# Patient Record
Sex: Female | Born: 1975 | Race: White | Hispanic: No | Marital: Married | Smoking: Never smoker
Health system: Southern US, Community
[De-identification: ages and names within clinical notes are randomized; demographics above are authoritative.]

## PROBLEM LIST (undated history)

## (undated) DIAGNOSIS — F32A Depression, unspecified: Secondary | ICD-10-CM

## (undated) DIAGNOSIS — F329 Major depressive disorder, single episode, unspecified: Secondary | ICD-10-CM

---

## 1998-10-18 ENCOUNTER — Encounter: Admission: RE | Admit: 1998-10-18 | Discharge: 1999-01-16 | Payer: Self-pay | Admitting: Obstetrics and Gynecology

## 1999-02-05 ENCOUNTER — Inpatient Hospital Stay (HOSPITAL_COMMUNITY): Admission: AD | Admit: 1999-02-05 | Discharge: 1999-02-07 | Payer: Self-pay | Admitting: Obstetrics & Gynecology

## 1999-09-18 ENCOUNTER — Other Ambulatory Visit: Admission: RE | Admit: 1999-09-18 | Discharge: 1999-09-18 | Payer: Self-pay | Admitting: Gynecology

## 2000-09-26 ENCOUNTER — Other Ambulatory Visit: Admission: RE | Admit: 2000-09-26 | Discharge: 2000-09-26 | Payer: Self-pay | Admitting: Gynecology

## 2001-10-06 ENCOUNTER — Other Ambulatory Visit: Admission: RE | Admit: 2001-10-06 | Discharge: 2001-10-06 | Payer: Self-pay | Admitting: Gynecology

## 2002-10-12 ENCOUNTER — Other Ambulatory Visit: Admission: RE | Admit: 2002-10-12 | Discharge: 2002-10-12 | Payer: Self-pay | Admitting: Obstetrics and Gynecology

## 2003-11-17 ENCOUNTER — Other Ambulatory Visit: Admission: RE | Admit: 2003-11-17 | Discharge: 2003-11-17 | Payer: Self-pay | Admitting: Obstetrics and Gynecology

## 2004-03-28 ENCOUNTER — Encounter: Admission: RE | Admit: 2004-03-28 | Discharge: 2004-03-28 | Payer: Self-pay | Admitting: Obstetrics and Gynecology

## 2004-06-01 ENCOUNTER — Inpatient Hospital Stay (HOSPITAL_COMMUNITY): Admission: AD | Admit: 2004-06-01 | Discharge: 2004-06-03 | Payer: Self-pay | Admitting: Pediatrics

## 2004-07-05 ENCOUNTER — Other Ambulatory Visit: Admission: RE | Admit: 2004-07-05 | Discharge: 2004-07-05 | Payer: Self-pay | Admitting: Obstetrics and Gynecology

## 2005-08-16 ENCOUNTER — Other Ambulatory Visit: Admission: RE | Admit: 2005-08-16 | Discharge: 2005-08-16 | Payer: Self-pay | Admitting: Obstetrics and Gynecology

## 2009-10-17 ENCOUNTER — Encounter: Admission: RE | Admit: 2009-10-17 | Discharge: 2009-10-17 | Payer: Self-pay | Admitting: Gastroenterology

## 2011-04-12 ENCOUNTER — Emergency Department (HOSPITAL_BASED_OUTPATIENT_CLINIC_OR_DEPARTMENT_OTHER)
Admission: EM | Admit: 2011-04-12 | Discharge: 2011-04-12 | Disposition: A | Discharge status: Home / Self Care | Payer: BC Managed Care – PPO | Attending: Emergency Medicine | Admitting: Emergency Medicine

## 2011-04-12 ENCOUNTER — Emergency Department (INDEPENDENT_AMBULATORY_CARE_PROVIDER_SITE_OTHER): Payer: BC Managed Care – PPO

## 2011-04-12 DIAGNOSIS — F411 Generalized anxiety disorder: Secondary | ICD-10-CM | POA: Insufficient documentation

## 2011-04-12 DIAGNOSIS — R0602 Shortness of breath: Secondary | ICD-10-CM

## 2011-05-31 ENCOUNTER — Other Ambulatory Visit: Payer: Self-pay | Admitting: Gastroenterology

## 2011-06-01 ENCOUNTER — Ambulatory Visit
Admission: RE | Admit: 2011-06-01 | Discharge: 2011-06-01 | Disposition: A | Discharge status: Home / Self Care | Payer: BC Managed Care – PPO | Source: Ambulatory Visit | Attending: Gastroenterology | Admitting: Gastroenterology

## 2012-07-17 ENCOUNTER — Other Ambulatory Visit: Payer: Self-pay | Admitting: Gastroenterology

## 2012-07-17 DIAGNOSIS — K824 Cholesterolosis of gallbladder: Secondary | ICD-10-CM

## 2012-07-23 ENCOUNTER — Ambulatory Visit
Admission: RE | Admit: 2012-07-23 | Discharge: 2012-07-23 | Disposition: A | Discharge status: Home / Self Care | Payer: BC Managed Care – PPO | Source: Ambulatory Visit | Attending: Gastroenterology | Admitting: Gastroenterology

## 2012-07-23 DIAGNOSIS — K824 Cholesterolosis of gallbladder: Secondary | ICD-10-CM

## 2013-07-28 ENCOUNTER — Other Ambulatory Visit: Payer: Self-pay | Admitting: Gastroenterology

## 2013-07-28 DIAGNOSIS — R109 Unspecified abdominal pain: Secondary | ICD-10-CM

## 2013-07-31 ENCOUNTER — Ambulatory Visit
Admission: RE | Admit: 2013-07-31 | Discharge: 2013-07-31 | Disposition: A | Discharge status: Home / Self Care | Payer: BC Managed Care – PPO | Source: Ambulatory Visit | Attending: Gastroenterology | Admitting: Gastroenterology

## 2013-07-31 DIAGNOSIS — R109 Unspecified abdominal pain: Secondary | ICD-10-CM

## 2013-07-31 MED ORDER — IOHEXOL 300 MG/ML  SOLN
100.0000 mL | Freq: Once | INTRAMUSCULAR | Status: AC | PRN
  Administered 2013-07-31: 100 mL via INTRAVENOUS

## 2015-12-16 ENCOUNTER — Emergency Department (HOSPITAL_BASED_OUTPATIENT_CLINIC_OR_DEPARTMENT_OTHER): Payer: BLUE CROSS/BLUE SHIELD

## 2015-12-16 ENCOUNTER — Emergency Department (HOSPITAL_BASED_OUTPATIENT_CLINIC_OR_DEPARTMENT_OTHER)
Admission: EM | Admit: 2015-12-16 | Discharge: 2015-12-16 | Disposition: A | Discharge status: Home / Self Care | Payer: BLUE CROSS/BLUE SHIELD | Attending: Emergency Medicine | Admitting: Emergency Medicine

## 2015-12-16 ENCOUNTER — Encounter (HOSPITAL_BASED_OUTPATIENT_CLINIC_OR_DEPARTMENT_OTHER): Payer: Self-pay | Admitting: *Deleted

## 2015-12-16 DIAGNOSIS — R0602 Shortness of breath: Secondary | ICD-10-CM

## 2015-12-16 DIAGNOSIS — Z793 Long term (current) use of hormonal contraceptives: Secondary | ICD-10-CM | POA: Diagnosis not present

## 2015-12-16 DIAGNOSIS — F319 Bipolar disorder, unspecified: Secondary | ICD-10-CM | POA: Insufficient documentation

## 2015-12-16 DIAGNOSIS — R7989 Other specified abnormal findings of blood chemistry: Secondary | ICD-10-CM | POA: Diagnosis not present

## 2015-12-16 HISTORY — DX: Depression, unspecified: F32.A

## 2015-12-16 HISTORY — DX: Major depressive disorder, single episode, unspecified: F32.9

## 2015-12-16 LAB — BASIC METABOLIC PANEL
Anion gap: 13 (ref 5–15)
BUN: 12 mg/dL (ref 6–20)
CALCIUM: 9.6 mg/dL (ref 8.9–10.3)
CO2: 21 mmol/L — ABNORMAL LOW (ref 22–32)
Chloride: 105 mmol/L (ref 101–111)
Creatinine, Ser: 0.79 mg/dL (ref 0.44–1.00)
GFR calc Af Amer: >60 mL/min (ref >60)
GLUCOSE: 128 mg/dL — AB (ref 65–99)
Potassium: 3.4 mmol/L — ABNORMAL LOW (ref 3.5–5.1)
Sodium: 139 mmol/L (ref 135–145)

## 2015-12-16 MED ORDER — IOHEXOL 350 MG/ML SOLN
100.0000 mL | Freq: Once | INTRAVENOUS | Status: AC | PRN
  Administered 2015-12-16: 100 mL via INTRAVENOUS

## 2015-12-16 NOTE — Discharge Instructions (Signed)

## 2015-12-16 NOTE — ED Provider Notes (Signed)
CSN: [id number]     Arrival date & time 12/16/15  2016 History   First MD Initiated Contact with Patient 12/16/15 2023     Chief Complaint  Patient presents with  . Shortness of Breath     (Consider location/radiation/quality/duration/timing/severity/associated sxs/prior Treatment) Patient is a 40 y.o. female presenting with shortness of breath. The history is provided by the patient. No language interpreter was used.  Shortness of Breath Severity:  Moderate Onset quality:  Gradual Timing:  Constant Progression:  Worsening Chronicity:  New Context: not activity   Relieved by:  Nothing Worsened by:  Nothing tried Ineffective treatments:  None tried Associated symptoms: no sore throat   pt saw her primary MD today for shortness of breath  Pt was told that she had an elevated ddimer and was sent here for a ct scan to make sure she does not have a pe.   Past Medical History  Diagnosis Date  . Depressed    History reviewed. No pertinent past surgical history. History reviewed. No pertinent family history. Social History  Substance Use Topics  . Smoking status: Never Smoker   . Smokeless tobacco: None  . Alcohol Use: No   OB History    No data available     Review of Systems  HENT: Negative for sore throat.   Respiratory: Positive for shortness of breath.   All other systems reviewed and are negative.     Allergies  Review of patient's allergies indicates no known allergies.  Home Medications   Prior to Admission medications   Medication Sig Start Date End Date Taking? Authorizing Provider  buPROPion (WELLBUTRIN XL) 300 MG 24 hr tablet Take 300 mg by mouth daily.   Yes Historical Provider, MD  drospirenone-ethinyl estradiol (YAZ,GIANVI,LORYNA) 3-0.02 MG tablet Take 1 tablet by mouth daily.   Yes Historical Provider, MD   BP 153/98 mmHg  Pulse 101  Temp(Src) 98.2 F (36.8 C)  Resp 16  Ht  (1.6 m)  Wt 78.019 kg  BMI 30.48 kg/m2  SpO2 100%  LMP  12/02/2015 Physical Exam  Constitutional: She is oriented to person, place, and time. She appears well-developed and well-nourished.  HENT:  Head: Normocephalic and atraumatic.  Right Ear: External ear normal.  Left Ear: External ear normal.  Nose: Nose normal.  Mouth/Throat: Oropharynx is clear and moist.  Eyes: Conjunctivae and EOM are normal. Pupils are equal, round, and reactive to light.  Neck: Normal range of motion.  Cardiovascular: Normal rate, regular rhythm, normal heart sounds and intact distal pulses.   Pulmonary/Chest: Effort normal.  Abdominal: She exhibits no distension.  Musculoskeletal: Normal range of motion.  Neurological: She is alert and oriented to person, place, and time.  Skin: Skin is warm.  Psychiatric: She has a normal mood and affect.  Nursing note and vitals reviewed.   ED Course  Procedures (including critical care time) Labs Review Labs Reviewed  BASIC METABOLIC PANEL - Abnormal; Notable for the following:    Potassium 3.4 (*)    CO2 21 (*)    Glucose, Bld 128 (*)    All other components within normal limits    Imaging Review Ct Angio Chest Pe W/cm &/or Wo Cm  12/16/2015  CLINICAL DATA:  Shortness of breath with positive D-dimer. EXAM: CT ANGIOGRAPHY CHEST WITH CONTRAST TECHNIQUE: Multidetector CT imaging of the chest was performed using the standard protocol during bolus administration of intravenous contrast. Multiplanar CT image reconstructions and MIPs were obtained to evaluate the vascular anatomy.  CONTRAST:  OMNIPAQUE IOHEXOL 350 MG/ML SOLN COMPARISON:  None. FINDINGS: THORACIC INLET/BODY WALL: No acute finding. The left lobe of the thyroid is larger than the right, but there is no demonstrable nodule. MEDIASTINUM: Normal heart size. No pericardial effusion. No evidence of pulmonary embolism. No aortic dissection. No adenopathy. LUNG WINDOWS: There is no edema, consolidation, effusion, or pneumothorax. 5 mm triangular subpleural nodule over  the left diaphragm, stable from 2014 CT and benign. UPPER ABDOMEN: No acute findings. OSSEOUS: No acute fracture.  No suspicious lytic or blastic lesions. Review of the MIP images confirms the above findings. IMPRESSION: No evidence of pulmonary embolism or other acute finding. Electronically Signed   By: Marnee Spring M.D.   On: 12/16/2015 21:16   I have personally reviewed and evaluated these images and lab results as part of my medical decision-making.   EKG Interpretation None     ekg  Sinus rhythm 96, left atrial enlargement, borderline t wave abnormality pr 624, qt is 357 Non acute  MDM  Pt advised to see her Md for recheck on Monday.  Pt advised to try mucinex otc.   Final diagnoses:  Shortness of breath        Elson Areas, PA-C 12/16/15 2254  Benjiman Core, MD 12/16/15 [phone]

## 2015-12-16 NOTE — ED Notes (Addendum)
Pt c/o SOB x 3 days denies CP  Sent here from [facility] for elevated d-dimer

## 2016-08-01 IMAGING — CT CT ANGIO CHEST
2 of 8 series · 19 of 46 positions shown · IV contrast (omnipaque)
Comparison: None.

CLINICAL DATA: Shortness of breath with positive D-dimer.

EXAM:
CT ANGIOGRAPHY CHEST WITH CONTRAST
TECHNIQUE: Multidetector CT imaging of the chest was performed using the
standard protocol during bolus administration of intravenous
contrast. Multiplanar CT image reconstructions and MIPs were
obtained to evaluate the vascular anatomy.
CONTRAST:  100mL OMNIPAQUE IOHEXOL 350 MG/ML SOLN

[Series 7: coronal mpr · coronal · 0.49mm/px · 3 of 120 slices shown]
[im 30/120  soft-tissue]
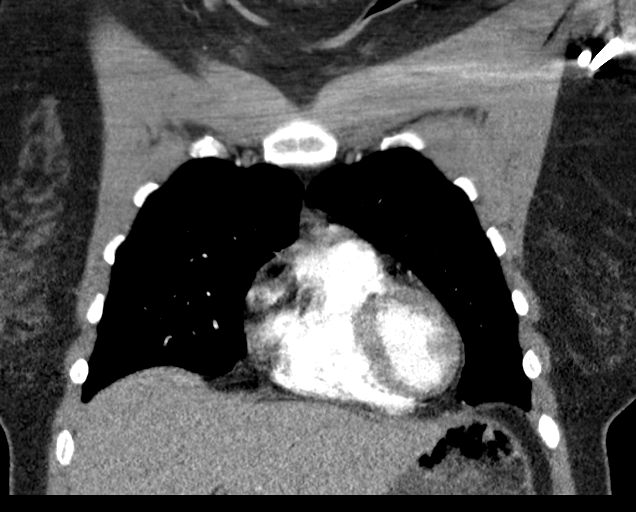
[im 60/120  soft-tissue]
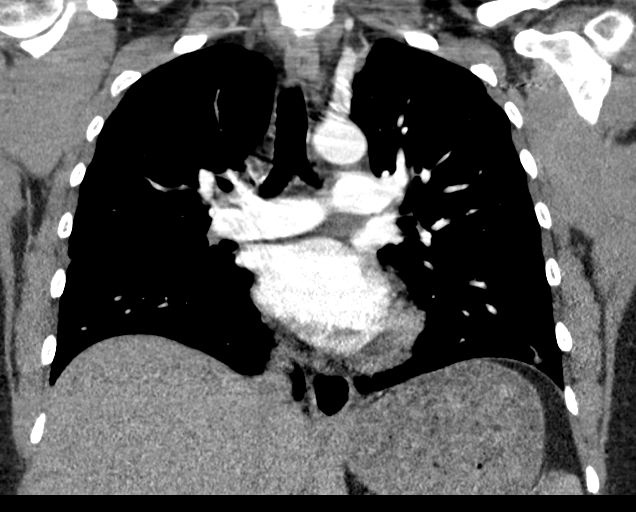
[im 90/120  soft-tissue]
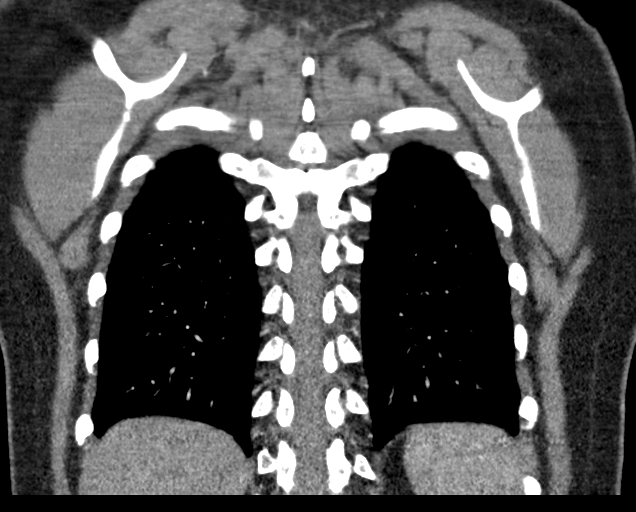

[Series 11: thins · axial · 0.70mm/px · z∈[+997,+1245]mm · 16 of 274 slices shown]
[im 13/274  lung]
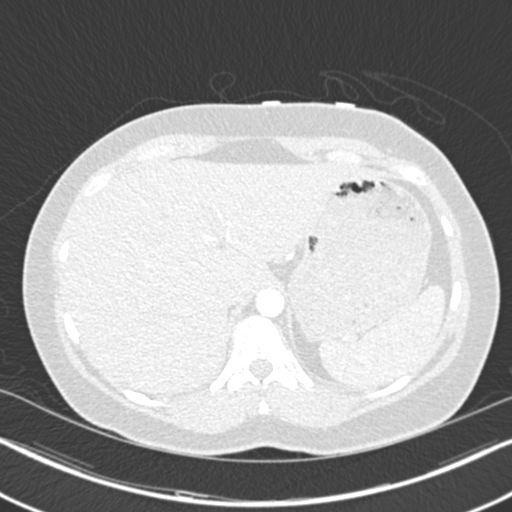
[im 25/274  soft-tissue]
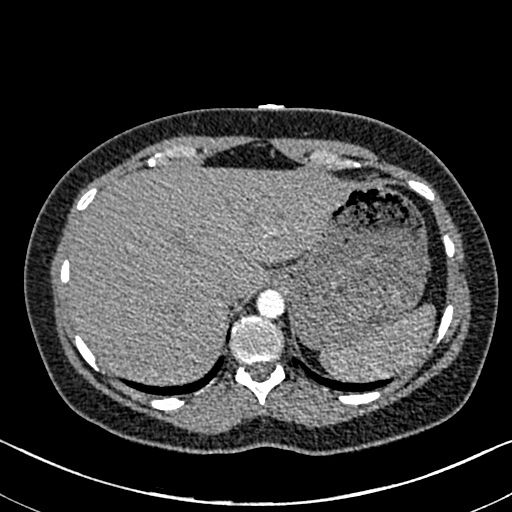
[im 50/274  lung]
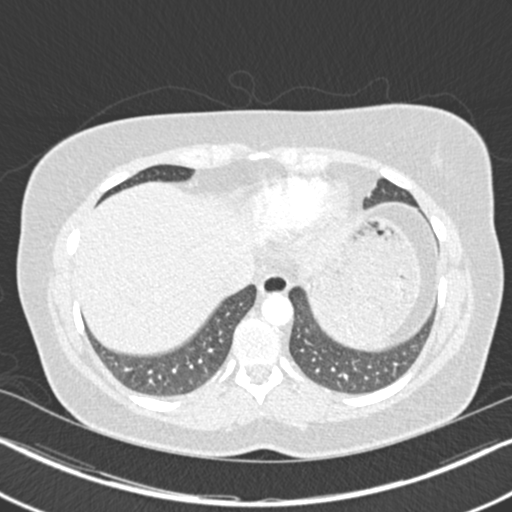
[im 63/274  soft-tissue]
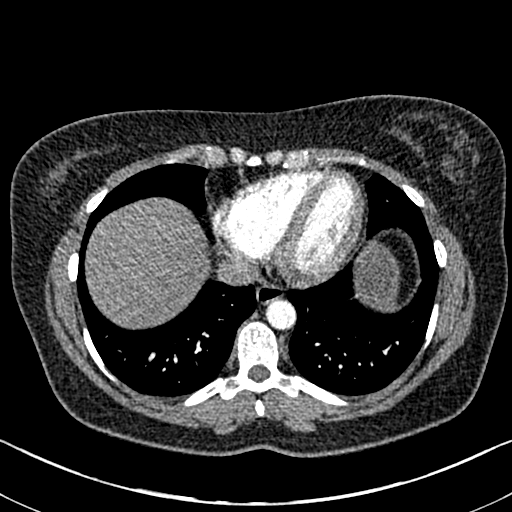
[im 75/274  lung]
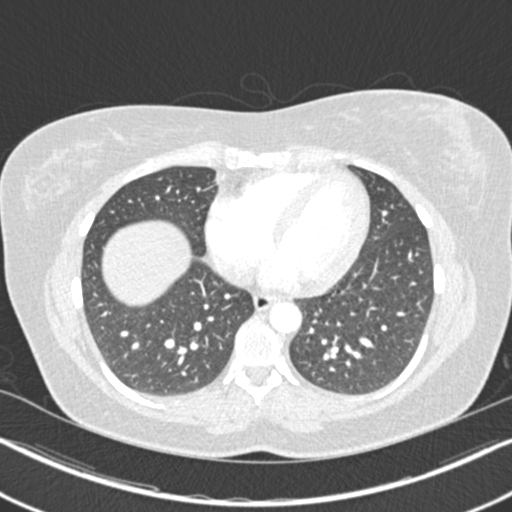
[im 100/274  soft-tissue]
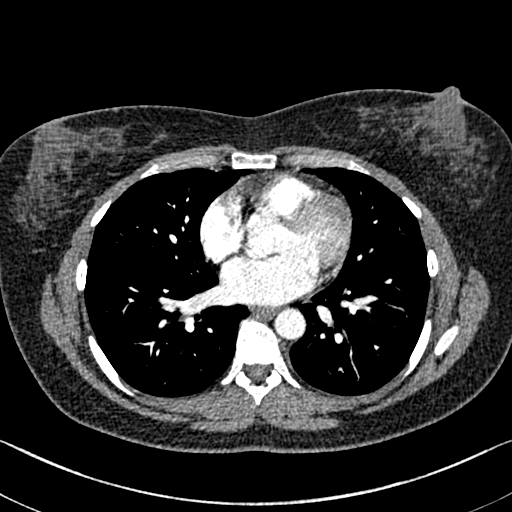
[im 112/274  lung]
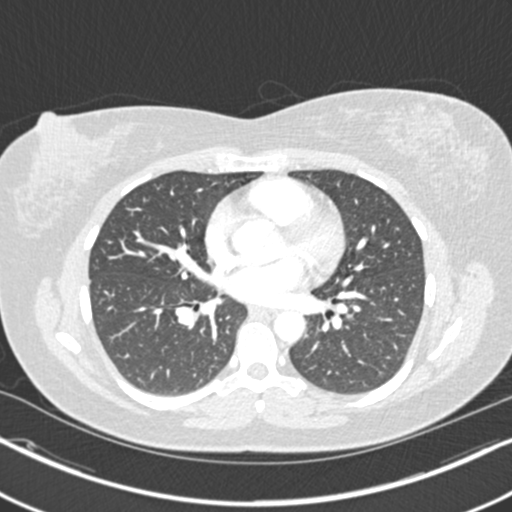
[im 125/274  soft-tissue]
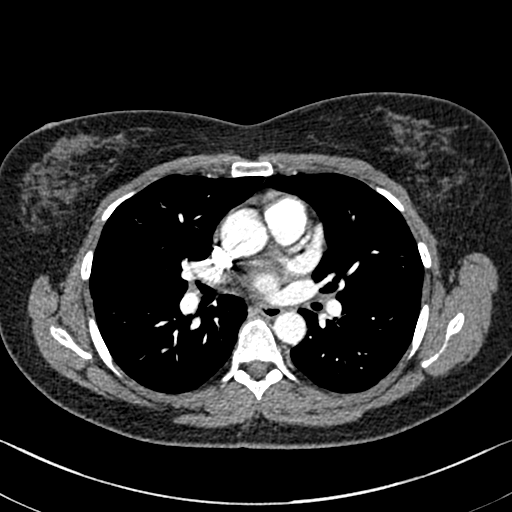
[im 149/274  lung]
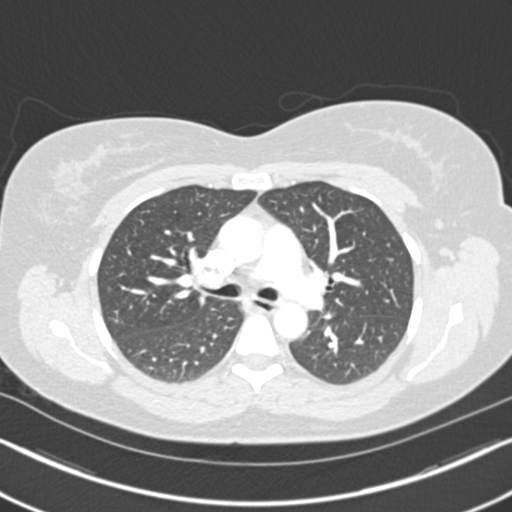
[im 162/274  soft-tissue]
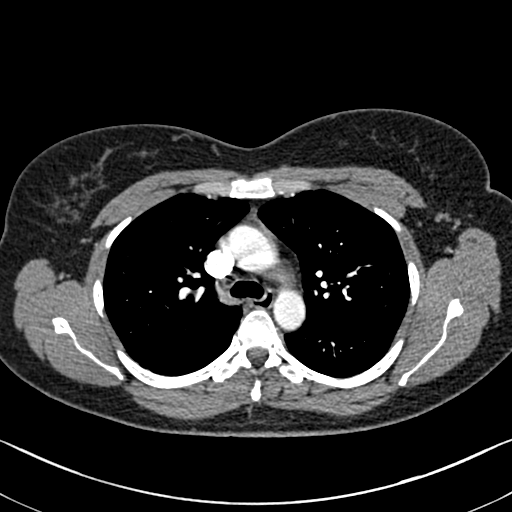
[im 174/274  lung]
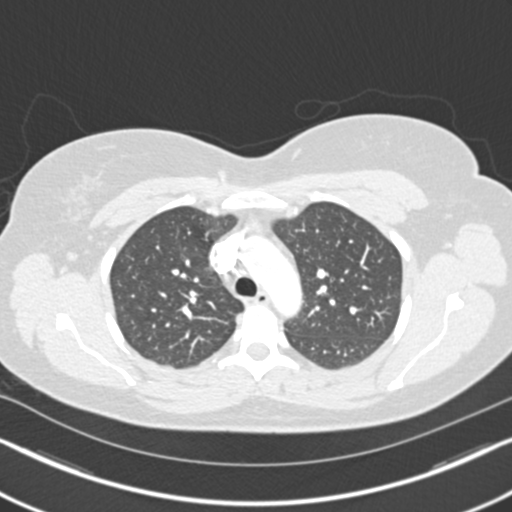
[im 199/274  soft-tissue]
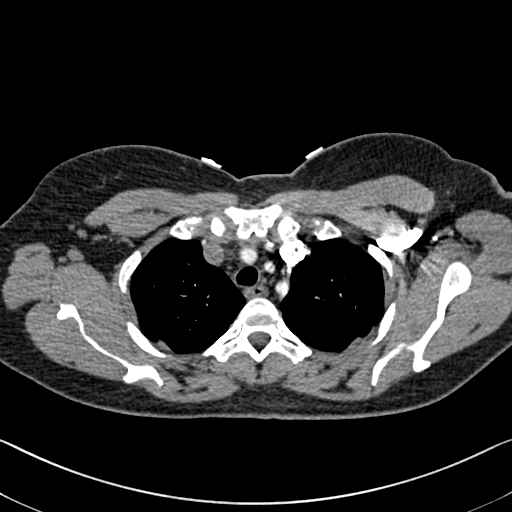
[im 211/274  lung]
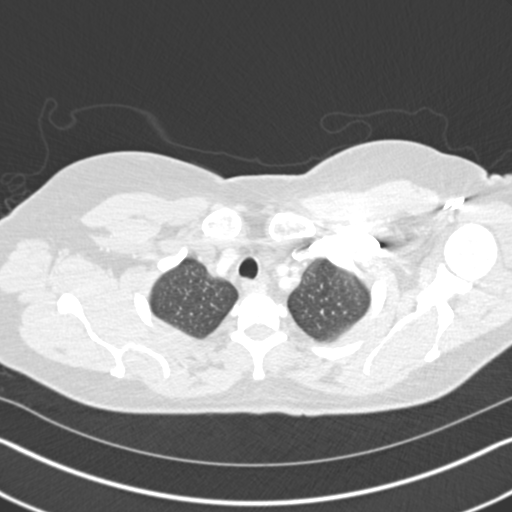
[im 224/274  soft-tissue]
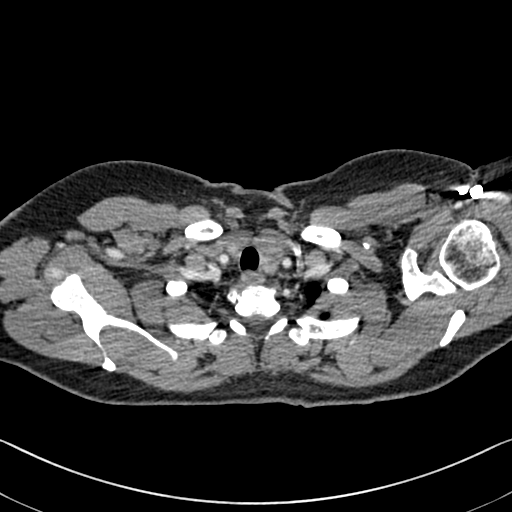
[im 249/274  lung]
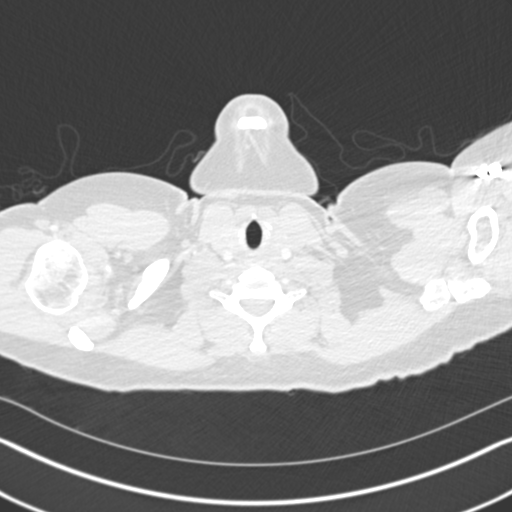
[im 261/274  soft-tissue]
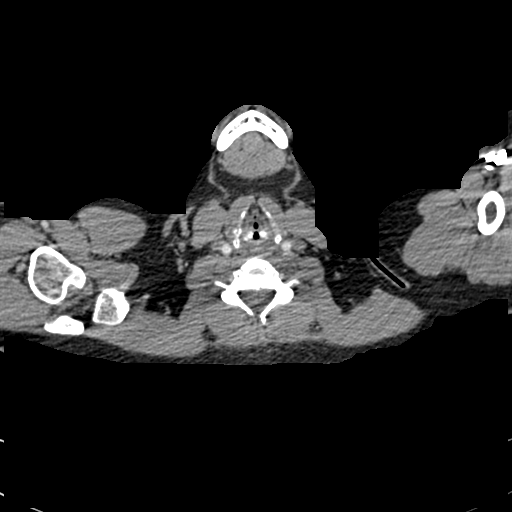

[19 of 46 positions shown; findings below may reference images not displayed]

FINDINGS: THORACIC INLET/BODY WALL:

No acute finding. The left lobe of the thyroid is larger than the
right, but there is no demonstrable nodule.

MEDIASTINUM:

Normal heart size. No pericardial effusion. No evidence of pulmonary
embolism. No aortic dissection. No adenopathy.

LUNG WINDOWS:

There is no edema, consolidation, effusion, or pneumothorax. 5 mm
triangular subpleural nodule over the left diaphragm, stable from
7634 CT and benign.

UPPER ABDOMEN:

No acute findings.

OSSEOUS:

No acute fracture.  No suspicious lytic or blastic lesions.

Review of the MIP images confirms the above findings.
IMPRESSION: No evidence of pulmonary embolism or other acute finding.

## 2018-12-16 ENCOUNTER — Other Ambulatory Visit: Payer: Self-pay | Admitting: Gastroenterology

## 2018-12-16 DIAGNOSIS — R1013 Epigastric pain: Secondary | ICD-10-CM

## 2018-12-18 ENCOUNTER — Ambulatory Visit
Admission: RE | Admit: 2018-12-18 | Discharge: 2018-12-18 | Disposition: A | Discharge status: Home / Self Care | Payer: BLUE CROSS/BLUE SHIELD | Source: Ambulatory Visit | Attending: Gastroenterology | Admitting: Gastroenterology

## 2018-12-18 DIAGNOSIS — R1013 Epigastric pain: Secondary | ICD-10-CM

## 2019-12-07 IMAGING — US US ABDOMEN COMPLETE
1 series · 13 of 25 positions shown · non-contrast
Comparison: CT of the abdomen and pelvis on 07/31/2013

CLINICAL DATA: Epigastric pain.

EXAM:
ABDOMEN ULTRASOUND COMPLETE

[Series 1: us abdomen complete · 0.19mm/px · 13 of 93 slices shown]
[im 1/93]
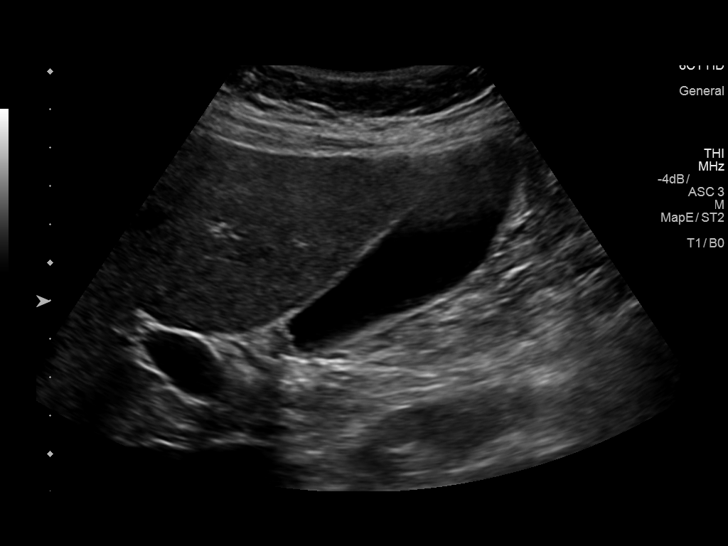
[im 8/93]
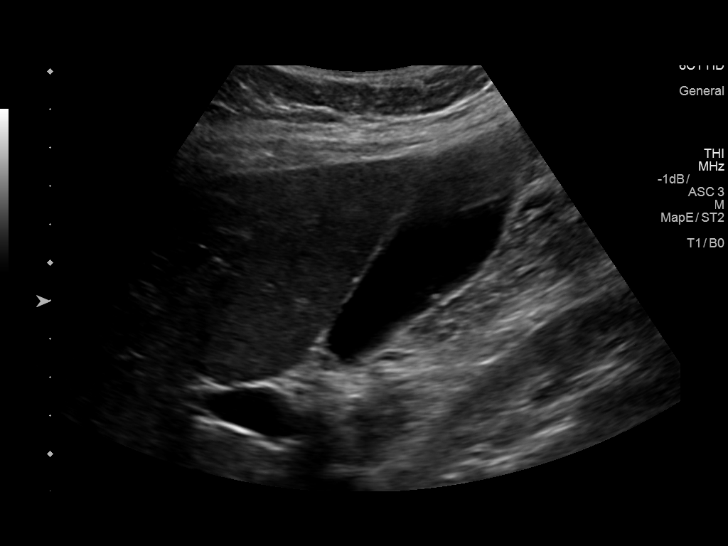
[im 16/93]
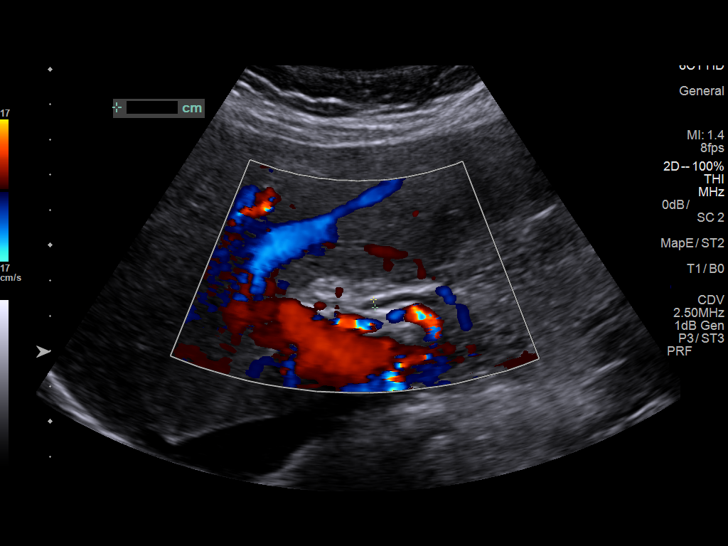
[im 24/93]
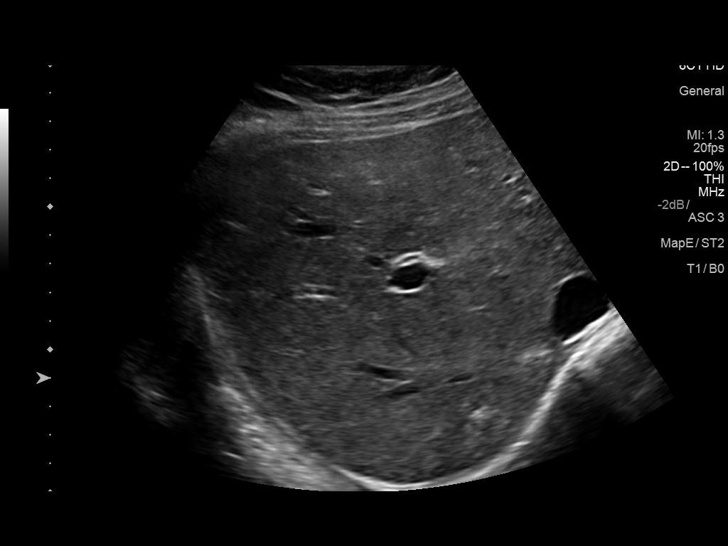
[im 31/93]
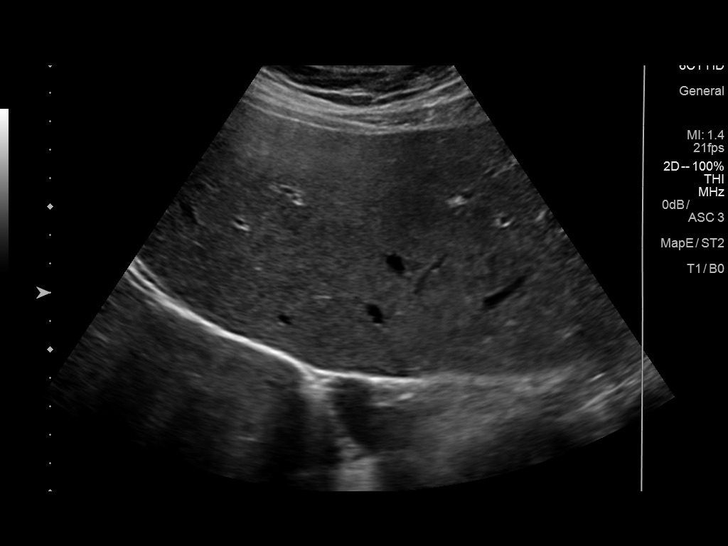
[im 39/93]
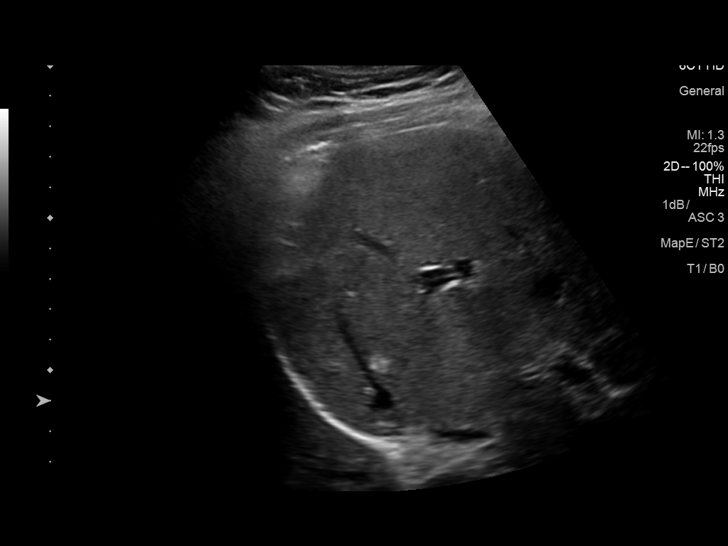
[im 47/93]
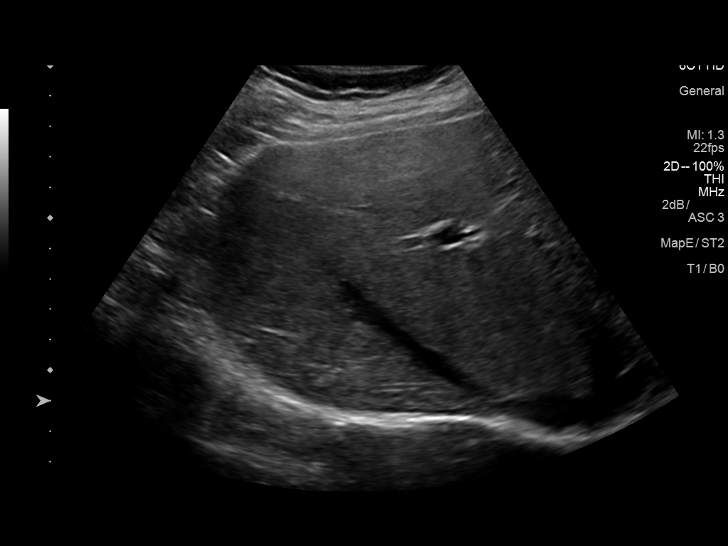
[im 54/93]
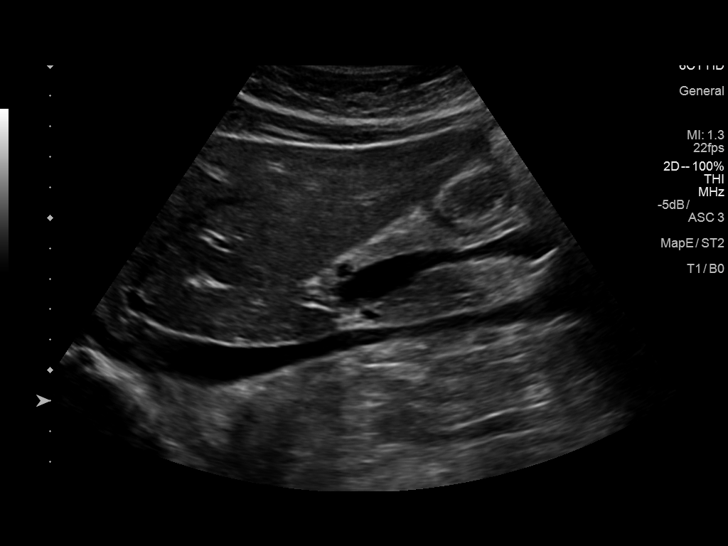
[im 62/93]
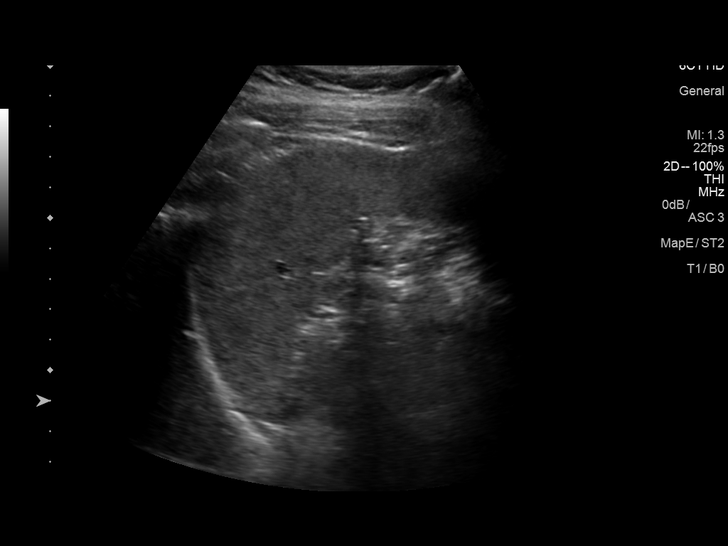
[im 70/93]
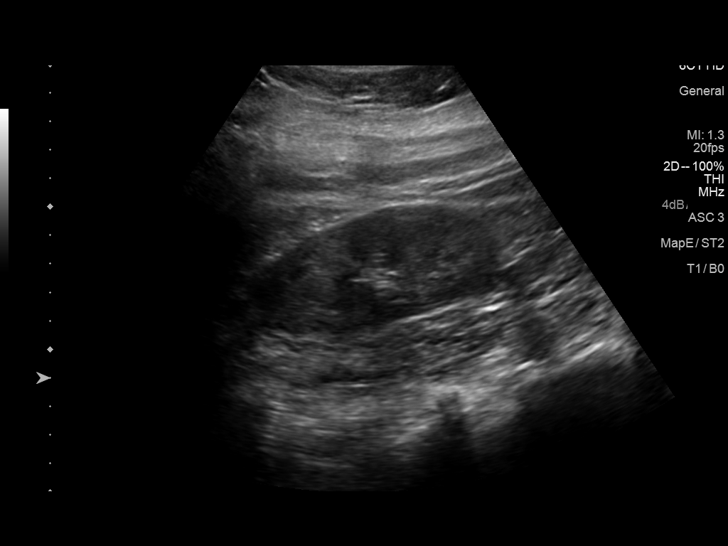
[im 77/93]
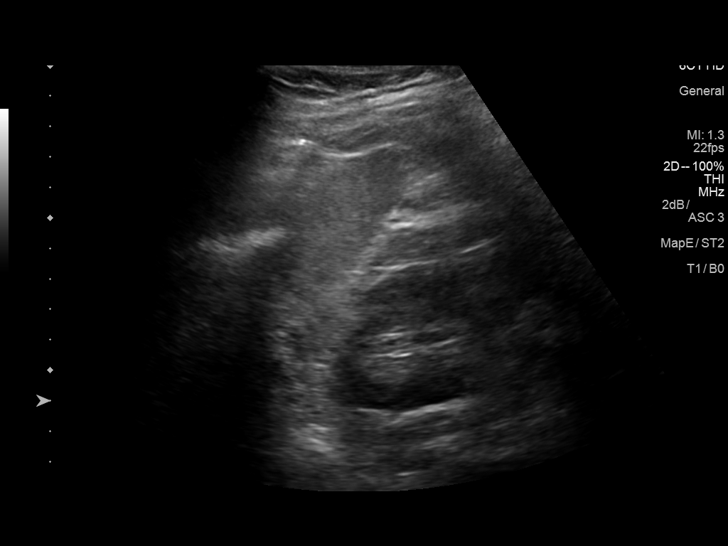
[im 85/93]
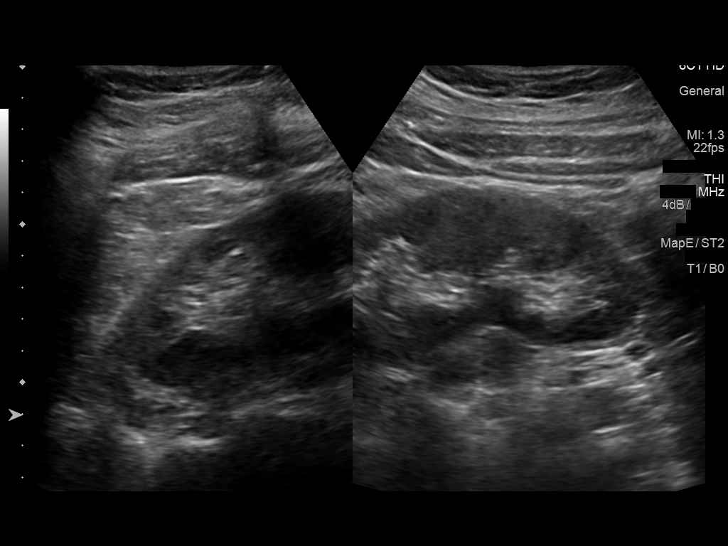
[im 93/93]
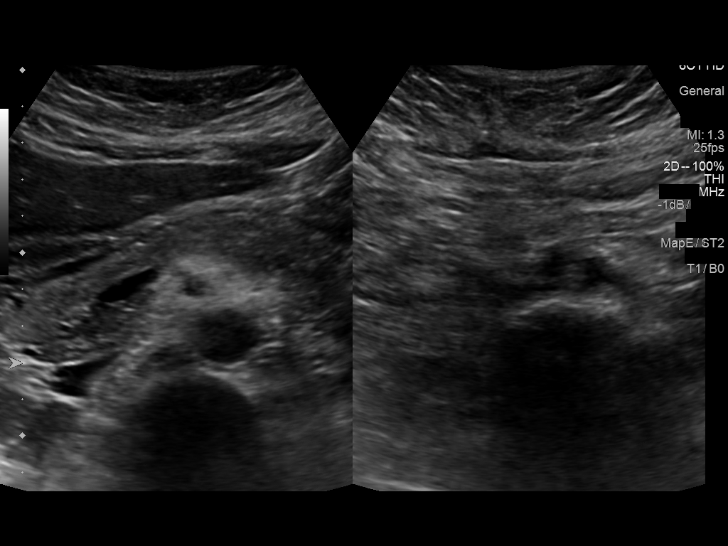

[13 of 25 positions shown; findings below may reference images not displayed]

FINDINGS: Gallbladder: A 4 millimeter polyp is identified along the
dependently wall of the gallbladder. No stones are identified. No
pericholecystic fluid or sonographic Murphy's sign.

Common bile duct: Diameter: 2.3 millimeters

Liver: A circumscribed hyperechoic lesion is identified in the RIGHT
hepatic lobe, measuring 0.7 x 0.7 x 0.6 centimeters. No hypoechoic
liver lesions are identified. Hepatic parenchyma is normal
echogenicity.

Portal vein is patent on color Doppler imaging with normal direction
of blood flow towards the liver.

IVC: No abnormality visualized.

Pancreas: Visualized portion unremarkable.

Spleen: Size and appearance within normal limits.

Right Kidney: Length: 11.3 centimeters. Echogenicity within normal
limits. No mass or hydronephrosis visualized.

Left Kidney: Length: 11.5 centimeters. Echogenicity within normal
limits. No mass or hydronephrosis visualized.

Abdominal aorta: No aneurysm visualized.

Other findings: None.
IMPRESSION: 1. Circumscribed hyperechoic lesion within the RIGHT hepatic lobe
measuring 7 millimeters. The lesion has a benign appearance and
likely represents a benign hemangioma. In the absence of known
malignancy or risk factors for liver tumors, no further evaluation
is needed. If the patient has a history of malignancy, recommend
further characterization with MRI liver protocol.
2. Small gallbladder polyp.  No evidence for acute cholecystitis.

## 2022-06-04 DIAGNOSIS — L258 Unspecified contact dermatitis due to other agents: Secondary | ICD-10-CM | POA: Diagnosis not present

## 2022-06-19 DIAGNOSIS — J3089 Other allergic rhinitis: Secondary | ICD-10-CM | POA: Diagnosis not present

## 2022-07-12 DIAGNOSIS — Z0000 Encounter for general adult medical examination without abnormal findings: Secondary | ICD-10-CM | POA: Diagnosis not present

## 2022-07-12 DIAGNOSIS — F411 Generalized anxiety disorder: Secondary | ICD-10-CM | POA: Diagnosis not present

## 2022-07-12 DIAGNOSIS — R002 Palpitations: Secondary | ICD-10-CM | POA: Diagnosis not present

## 2022-07-12 DIAGNOSIS — F330 Major depressive disorder, recurrent, mild: Secondary | ICD-10-CM | POA: Diagnosis not present

## 2022-09-07 DIAGNOSIS — R002 Palpitations: Secondary | ICD-10-CM | POA: Diagnosis not present

## 2022-09-07 DIAGNOSIS — R9431 Abnormal electrocardiogram [ECG] [EKG]: Secondary | ICD-10-CM | POA: Diagnosis not present

## 2022-09-21 DIAGNOSIS — R9431 Abnormal electrocardiogram [ECG] [EKG]: Secondary | ICD-10-CM | POA: Diagnosis not present

## 2022-09-21 DIAGNOSIS — R002 Palpitations: Secondary | ICD-10-CM | POA: Diagnosis not present

## 2022-10-16 DIAGNOSIS — M79602 Pain in left arm: Secondary | ICD-10-CM | POA: Diagnosis not present

## 2022-10-16 DIAGNOSIS — M542 Cervicalgia: Secondary | ICD-10-CM | POA: Diagnosis not present

## 2022-10-25 DIAGNOSIS — R002 Palpitations: Secondary | ICD-10-CM | POA: Diagnosis not present

## 2022-10-25 DIAGNOSIS — R9431 Abnormal electrocardiogram [ECG] [EKG]: Secondary | ICD-10-CM | POA: Diagnosis not present

## 2022-12-13 DIAGNOSIS — D225 Melanocytic nevi of trunk: Secondary | ICD-10-CM | POA: Diagnosis not present

## 2022-12-13 DIAGNOSIS — D0359 Melanoma in situ of other part of trunk: Secondary | ICD-10-CM | POA: Diagnosis not present

## 2022-12-13 DIAGNOSIS — Z85820 Personal history of malignant melanoma of skin: Secondary | ICD-10-CM | POA: Diagnosis not present

## 2022-12-13 DIAGNOSIS — Z1283 Encounter for screening for malignant neoplasm of skin: Secondary | ICD-10-CM | POA: Diagnosis not present

## 2022-12-13 DIAGNOSIS — D485 Neoplasm of uncertain behavior of skin: Secondary | ICD-10-CM | POA: Diagnosis not present

## 2022-12-13 DIAGNOSIS — X32XXXD Exposure to sunlight, subsequent encounter: Secondary | ICD-10-CM | POA: Diagnosis not present

## 2022-12-13 DIAGNOSIS — Z08 Encounter for follow-up examination after completed treatment for malignant neoplasm: Secondary | ICD-10-CM | POA: Diagnosis not present

## 2022-12-13 DIAGNOSIS — L570 Actinic keratosis: Secondary | ICD-10-CM | POA: Diagnosis not present

## 2022-12-28 DIAGNOSIS — D485 Neoplasm of uncertain behavior of skin: Secondary | ICD-10-CM | POA: Diagnosis not present

## 2022-12-28 DIAGNOSIS — L988 Other specified disorders of the skin and subcutaneous tissue: Secondary | ICD-10-CM | POA: Diagnosis not present

## 2022-12-28 DIAGNOSIS — D0359 Melanoma in situ of other part of trunk: Secondary | ICD-10-CM | POA: Diagnosis not present

## 2023-01-17 DIAGNOSIS — Z6832 Body mass index (BMI) 32.0-32.9, adult: Secondary | ICD-10-CM | POA: Diagnosis not present

## 2023-01-17 DIAGNOSIS — Z1231 Encounter for screening mammogram for malignant neoplasm of breast: Secondary | ICD-10-CM | POA: Diagnosis not present

## 2023-01-17 DIAGNOSIS — Z01419 Encounter for gynecological examination (general) (routine) without abnormal findings: Secondary | ICD-10-CM | POA: Diagnosis not present

## 2023-01-22 ENCOUNTER — Other Ambulatory Visit: Payer: Self-pay

## 2023-01-22 DIAGNOSIS — R928 Other abnormal and inconclusive findings on diagnostic imaging of breast: Secondary | ICD-10-CM

## 2023-01-26 DIAGNOSIS — Z8719 Personal history of other diseases of the digestive system: Secondary | ICD-10-CM | POA: Diagnosis not present

## 2023-01-26 DIAGNOSIS — Z79890 Hormone replacement therapy (postmenopausal): Secondary | ICD-10-CM | POA: Diagnosis not present

## 2023-01-26 DIAGNOSIS — K8000 Calculus of gallbladder with acute cholecystitis without obstruction: Secondary | ICD-10-CM | POA: Diagnosis not present

## 2023-01-26 DIAGNOSIS — E669 Obesity, unspecified: Secondary | ICD-10-CM | POA: Diagnosis not present

## 2023-01-26 DIAGNOSIS — K589 Irritable bowel syndrome without diarrhea: Secondary | ICD-10-CM | POA: Diagnosis not present

## 2023-01-26 DIAGNOSIS — R7401 Elevation of levels of liver transaminase levels: Secondary | ICD-10-CM | POA: Diagnosis not present

## 2023-01-26 DIAGNOSIS — Z6832 Body mass index (BMI) 32.0-32.9, adult: Secondary | ICD-10-CM | POA: Diagnosis not present

## 2023-01-26 DIAGNOSIS — R110 Nausea: Secondary | ICD-10-CM | POA: Diagnosis not present

## 2023-01-26 DIAGNOSIS — K819 Cholecystitis, unspecified: Secondary | ICD-10-CM | POA: Diagnosis not present

## 2023-01-26 DIAGNOSIS — R109 Unspecified abdominal pain: Secondary | ICD-10-CM | POA: Diagnosis not present

## 2023-01-26 DIAGNOSIS — R160 Hepatomegaly, not elsewhere classified: Secondary | ICD-10-CM | POA: Diagnosis not present

## 2023-01-26 DIAGNOSIS — K811 Chronic cholecystitis: Secondary | ICD-10-CM | POA: Diagnosis not present

## 2023-01-26 DIAGNOSIS — F411 Generalized anxiety disorder: Secondary | ICD-10-CM | POA: Diagnosis not present

## 2023-01-26 DIAGNOSIS — Z79899 Other long term (current) drug therapy: Secondary | ICD-10-CM | POA: Diagnosis not present

## 2023-01-26 DIAGNOSIS — F330 Major depressive disorder, recurrent, mild: Secondary | ICD-10-CM | POA: Diagnosis not present

## 2023-01-26 DIAGNOSIS — R1011 Right upper quadrant pain: Secondary | ICD-10-CM | POA: Diagnosis not present

## 2023-01-26 DIAGNOSIS — N3289 Other specified disorders of bladder: Secondary | ICD-10-CM | POA: Diagnosis not present

## 2023-01-26 DIAGNOSIS — K8020 Calculus of gallbladder without cholecystitis without obstruction: Secondary | ICD-10-CM | POA: Diagnosis not present

## 2023-01-28 DIAGNOSIS — K8000 Calculus of gallbladder with acute cholecystitis without obstruction: Secondary | ICD-10-CM | POA: Diagnosis not present

## 2023-02-11 ENCOUNTER — Ambulatory Visit
Admission: RE | Admit: 2023-02-11 | Discharge: 2023-02-11 | Disposition: A | Discharge status: Home / Self Care | Payer: BC Managed Care – PPO | Source: Ambulatory Visit | Attending: Obstetrics and Gynecology | Admitting: Obstetrics and Gynecology

## 2023-02-11 DIAGNOSIS — R928 Other abnormal and inconclusive findings on diagnostic imaging of breast: Secondary | ICD-10-CM

## 2023-02-11 DIAGNOSIS — R921 Mammographic calcification found on diagnostic imaging of breast: Secondary | ICD-10-CM | POA: Diagnosis not present

## 2023-02-13 ENCOUNTER — Other Ambulatory Visit: Payer: Self-pay | Admitting: Obstetrics and Gynecology

## 2023-02-13 DIAGNOSIS — R928 Other abnormal and inconclusive findings on diagnostic imaging of breast: Secondary | ICD-10-CM

## 2023-02-13 DIAGNOSIS — R921 Mammographic calcification found on diagnostic imaging of breast: Secondary | ICD-10-CM

## 2023-02-18 ENCOUNTER — Ambulatory Visit
Admission: RE | Admit: 2023-02-18 | Discharge: 2023-02-18 | Disposition: A | Discharge status: Home / Self Care | Payer: BC Managed Care – PPO | Source: Ambulatory Visit | Attending: Obstetrics and Gynecology | Admitting: Obstetrics and Gynecology

## 2023-02-18 DIAGNOSIS — N6011 Diffuse cystic mastopathy of right breast: Secondary | ICD-10-CM | POA: Diagnosis not present

## 2023-02-18 DIAGNOSIS — R921 Mammographic calcification found on diagnostic imaging of breast: Secondary | ICD-10-CM | POA: Diagnosis not present

## 2023-02-18 HISTORY — PX: BREAST BIOPSY: SHX20

## 2023-03-19 DIAGNOSIS — Z9889 Other specified postprocedural states: Secondary | ICD-10-CM | POA: Diagnosis not present

## 2023-03-19 DIAGNOSIS — Z9049 Acquired absence of other specified parts of digestive tract: Secondary | ICD-10-CM | POA: Diagnosis not present

## 2023-04-03 DIAGNOSIS — K921 Melena: Secondary | ICD-10-CM | POA: Diagnosis not present

## 2023-04-03 DIAGNOSIS — Z79899 Other long term (current) drug therapy: Secondary | ICD-10-CM | POA: Diagnosis not present

## 2023-04-03 DIAGNOSIS — R7989 Other specified abnormal findings of blood chemistry: Secondary | ICD-10-CM | POA: Diagnosis not present

## 2023-04-03 DIAGNOSIS — Z86010 Personal history of colonic polyps: Secondary | ICD-10-CM | POA: Diagnosis not present

## 2023-05-01 DIAGNOSIS — D124 Benign neoplasm of descending colon: Secondary | ICD-10-CM | POA: Diagnosis not present

## 2023-05-01 DIAGNOSIS — K649 Unspecified hemorrhoids: Secondary | ICD-10-CM | POA: Diagnosis not present

## 2023-05-01 DIAGNOSIS — Z83719 Family history of colon polyps, unspecified: Secondary | ICD-10-CM | POA: Diagnosis not present

## 2023-05-01 DIAGNOSIS — Z09 Encounter for follow-up examination after completed treatment for conditions other than malignant neoplasm: Secondary | ICD-10-CM | POA: Diagnosis not present

## 2023-05-01 DIAGNOSIS — Z86010 Personal history of colonic polyps: Secondary | ICD-10-CM | POA: Diagnosis not present

## 2023-05-01 DIAGNOSIS — D123 Benign neoplasm of transverse colon: Secondary | ICD-10-CM | POA: Diagnosis not present

## 2023-08-26 DIAGNOSIS — R1013 Epigastric pain: Secondary | ICD-10-CM | POA: Diagnosis not present

## 2023-08-26 DIAGNOSIS — K2100 Gastro-esophageal reflux disease with esophagitis, without bleeding: Secondary | ICD-10-CM | POA: Diagnosis not present

## 2023-12-27 DIAGNOSIS — M542 Cervicalgia: Secondary | ICD-10-CM | POA: Diagnosis not present

## 2023-12-27 DIAGNOSIS — M25552 Pain in left hip: Secondary | ICD-10-CM | POA: Diagnosis not present

## 2024-02-06 ENCOUNTER — Other Ambulatory Visit (HOSPITAL_COMMUNITY): Payer: Self-pay | Admitting: Sports Medicine

## 2024-02-06 DIAGNOSIS — M542 Cervicalgia: Secondary | ICD-10-CM | POA: Diagnosis not present

## 2024-02-07 ENCOUNTER — Ambulatory Visit (HOSPITAL_COMMUNITY)

## 2024-02-11 ENCOUNTER — Encounter (HOSPITAL_COMMUNITY): Payer: Self-pay

## 2024-02-11 ENCOUNTER — Ambulatory Visit (HOSPITAL_COMMUNITY)

## 2024-02-20 DIAGNOSIS — M542 Cervicalgia: Secondary | ICD-10-CM | POA: Diagnosis not present

## 2024-03-02 DIAGNOSIS — M5412 Radiculopathy, cervical region: Secondary | ICD-10-CM | POA: Diagnosis not present

## 2024-03-17 DIAGNOSIS — M5412 Radiculopathy, cervical region: Secondary | ICD-10-CM | POA: Diagnosis not present

## 2024-03-17 DIAGNOSIS — M545 Low back pain, unspecified: Secondary | ICD-10-CM | POA: Diagnosis not present

## 2024-03-20 DIAGNOSIS — M5412 Radiculopathy, cervical region: Secondary | ICD-10-CM | POA: Diagnosis not present

## 2024-03-24 DIAGNOSIS — M545 Low back pain, unspecified: Secondary | ICD-10-CM | POA: Diagnosis not present

## 2024-03-24 DIAGNOSIS — M5412 Radiculopathy, cervical region: Secondary | ICD-10-CM | POA: Diagnosis not present

## 2024-03-26 DIAGNOSIS — M5412 Radiculopathy, cervical region: Secondary | ICD-10-CM | POA: Diagnosis not present

## 2024-03-26 DIAGNOSIS — M545 Low back pain, unspecified: Secondary | ICD-10-CM | POA: Diagnosis not present

## 2024-04-08 DIAGNOSIS — Z1329 Encounter for screening for other suspected endocrine disorder: Secondary | ICD-10-CM | POA: Diagnosis not present

## 2024-04-08 DIAGNOSIS — Z13220 Encounter for screening for lipoid disorders: Secondary | ICD-10-CM | POA: Diagnosis not present

## 2024-04-08 DIAGNOSIS — Z0000 Encounter for general adult medical examination without abnormal findings: Secondary | ICD-10-CM | POA: Diagnosis not present

## 2024-04-21 DIAGNOSIS — Z1231 Encounter for screening mammogram for malignant neoplasm of breast: Secondary | ICD-10-CM | POA: Diagnosis not present

## 2024-04-21 DIAGNOSIS — F411 Generalized anxiety disorder: Secondary | ICD-10-CM | POA: Diagnosis not present

## 2024-04-21 DIAGNOSIS — Z01419 Encounter for gynecological examination (general) (routine) without abnormal findings: Secondary | ICD-10-CM | POA: Diagnosis not present

## 2024-04-21 DIAGNOSIS — Z6832 Body mass index (BMI) 32.0-32.9, adult: Secondary | ICD-10-CM | POA: Diagnosis not present

## 2024-04-21 DIAGNOSIS — Z124 Encounter for screening for malignant neoplasm of cervix: Secondary | ICD-10-CM | POA: Diagnosis not present

## 2024-05-12 DIAGNOSIS — M545 Low back pain, unspecified: Secondary | ICD-10-CM | POA: Diagnosis not present
